# Patient Record
Sex: Female | Born: 1950 | Race: Black or African American | Hispanic: No | Smoking: Never smoker
Health system: Southern US, Community
[De-identification: ages and names within clinical notes are randomized; demographics above are authoritative.]

## PROBLEM LIST (undated history)

## (undated) ENCOUNTER — Emergency Department (HOSPITAL_COMMUNITY): Payer: Medicare Other | Source: Home / Self Care

## (undated) DIAGNOSIS — I10 Essential (primary) hypertension: Secondary | ICD-10-CM

## (undated) DIAGNOSIS — M797 Fibromyalgia: Secondary | ICD-10-CM

## (undated) DIAGNOSIS — Z9889 Other specified postprocedural states: Secondary | ICD-10-CM

## (undated) DIAGNOSIS — R112 Nausea with vomiting, unspecified: Secondary | ICD-10-CM

## (undated) DIAGNOSIS — M3500 Sicca syndrome, unspecified: Secondary | ICD-10-CM

## (undated) DIAGNOSIS — K219 Gastro-esophageal reflux disease without esophagitis: Secondary | ICD-10-CM

## (undated) HISTORY — PX: ESOPHAGEAL DILATION: SHX303

## (undated) HISTORY — PX: BREAST LUMPECTOMY: SHX2

## (undated) HISTORY — PX: REPLACEMENT TOTAL KNEE: SUR1224

## (undated) HISTORY — PX: ECTOPIC PREGNANCY SURGERY: SHX613

---

## 2005-01-31 ENCOUNTER — Ambulatory Visit: Payer: Self-pay | Admitting: Family Medicine

## 2005-08-12 HISTORY — PX: BREAST BIOPSY: SHX20

## 2006-06-04 ENCOUNTER — Ambulatory Visit: Payer: Self-pay | Admitting: Family Medicine

## 2006-06-11 ENCOUNTER — Ambulatory Visit: Payer: Self-pay | Admitting: Family Medicine

## 2006-07-22 ENCOUNTER — Ambulatory Visit: Payer: Self-pay | Admitting: General Surgery

## 2006-09-19 ENCOUNTER — Other Ambulatory Visit: Payer: Self-pay

## 2006-09-19 ENCOUNTER — Emergency Department: Payer: Self-pay

## 2007-01-16 ENCOUNTER — Ambulatory Visit: Payer: Self-pay | Admitting: General Surgery

## 2007-04-07 IMAGING — MG MM BREAST NEEDLE LOCALIZATION*L*
6 series · 6 of 6 positions shown · non-contrast
Comparison: none

REASON FOR EXAM: left breast microcalcification       surg 10 a m
COMMENTS:

[L CC (1 of 6)]
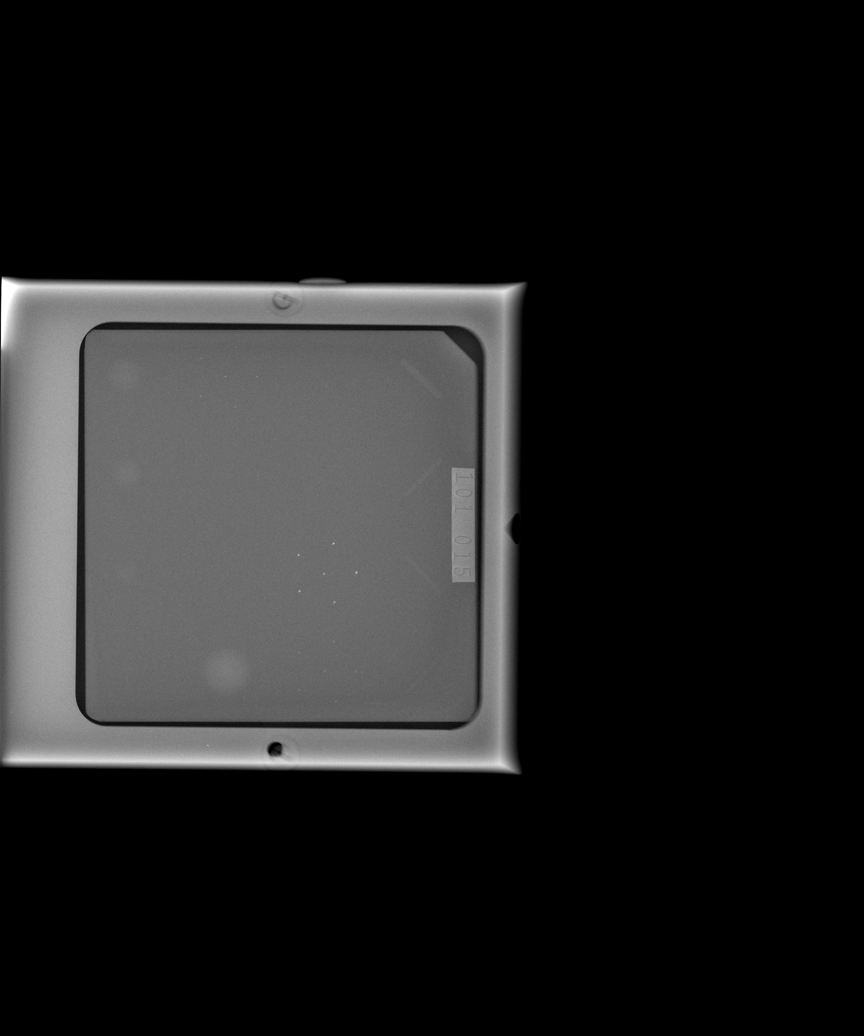

[L CC (2 of 6)]
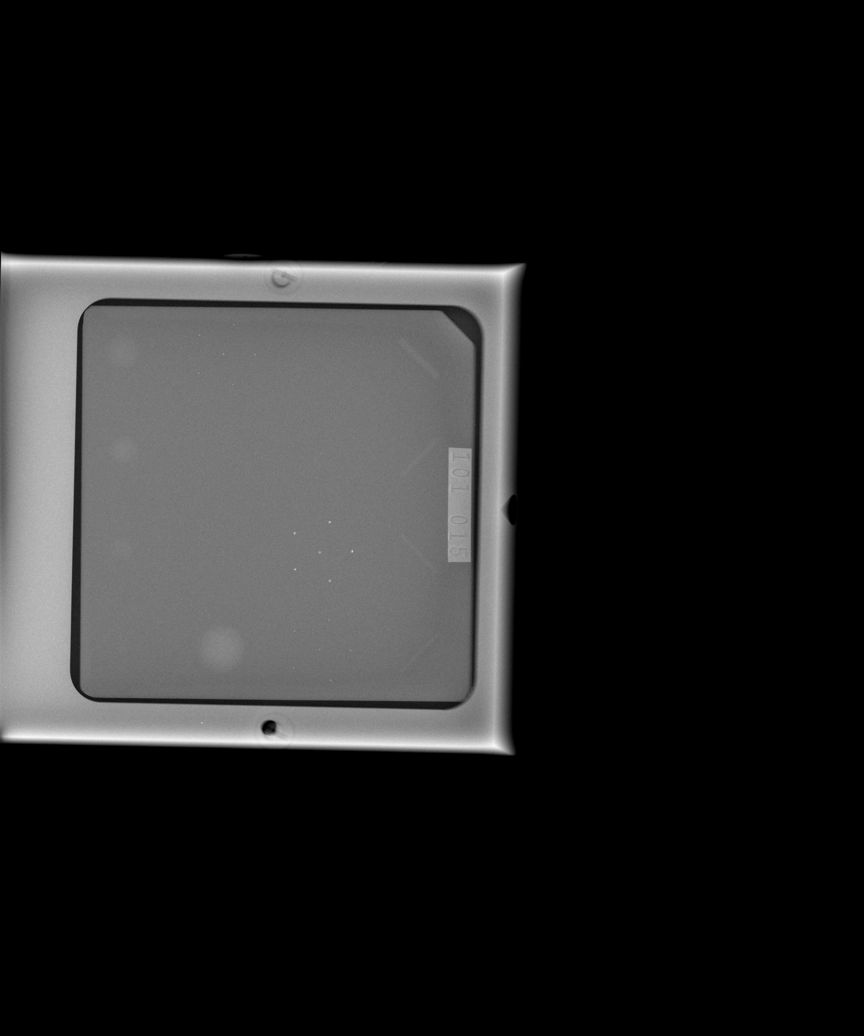

[L CC (3 of 6)]
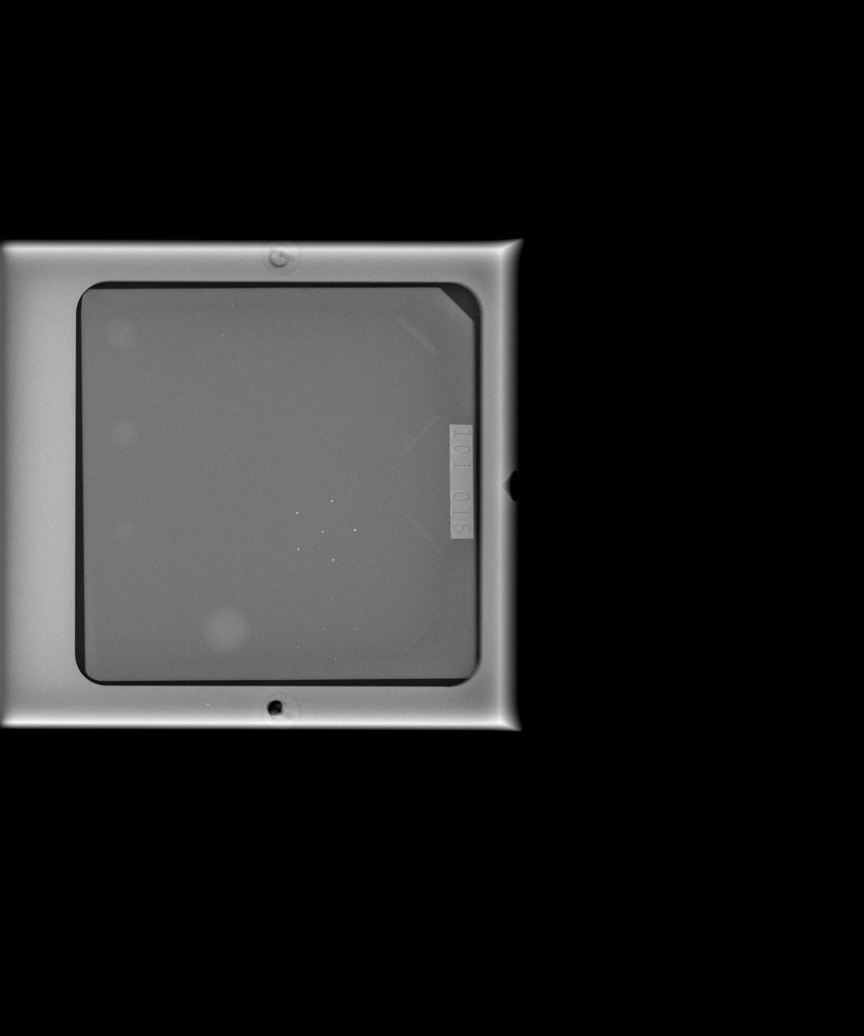

[L CC (4 of 6)]
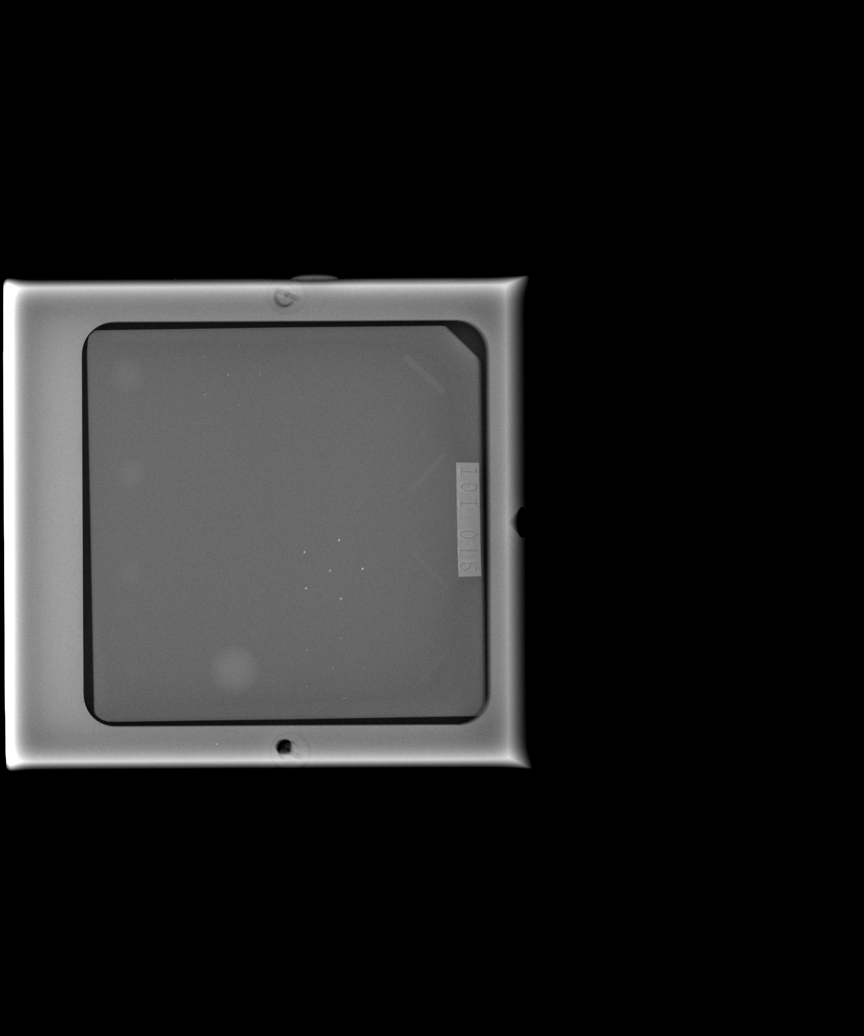

[L CC (5 of 6)]
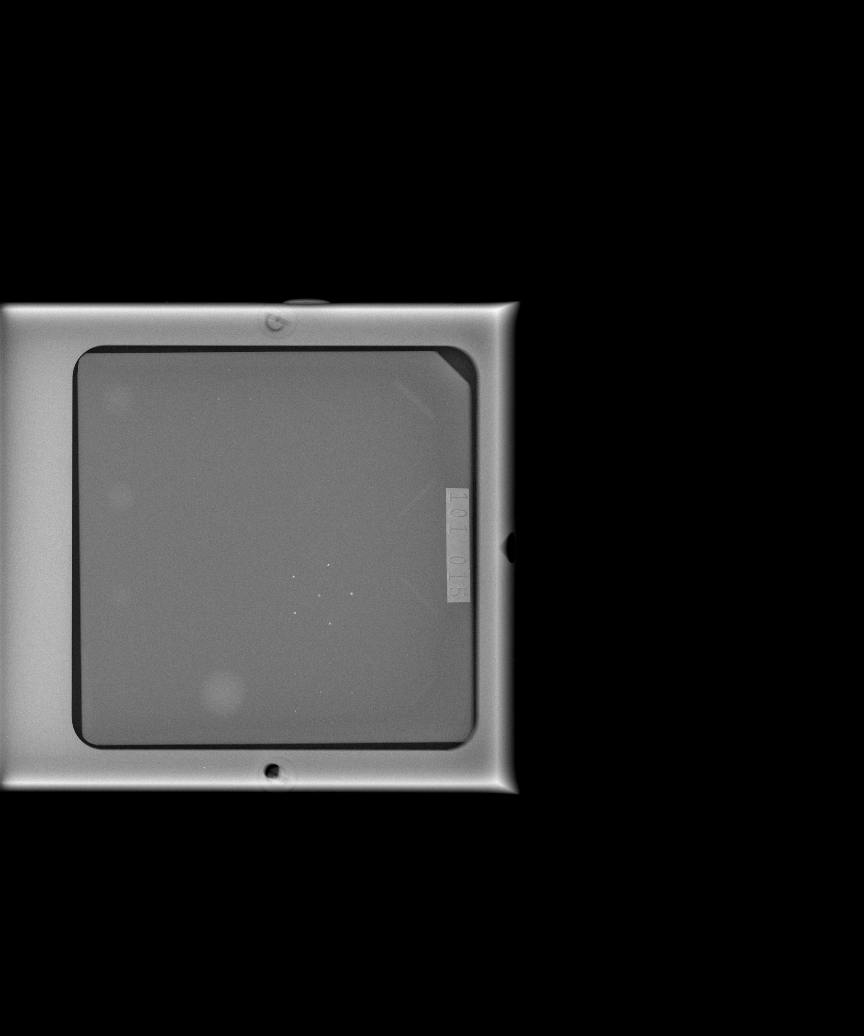

[L CC (6 of 6)]
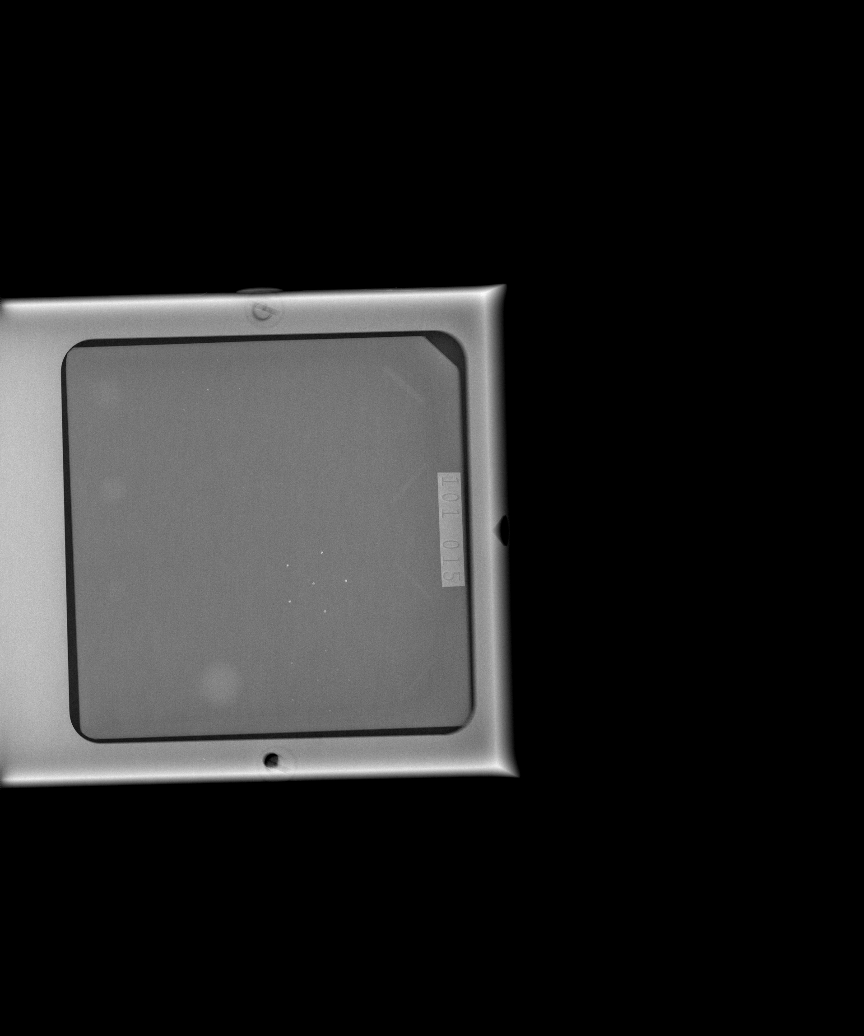

[6 of 6 positions shown; findings below may reference images not displayed]

PROCEDURE:     MAM - MAM BREAST NEEDLE LOCAL LT  - July 22, 2006  [DATE]

RESULT:     The previously recognized pathologic cluster of
microcalcifications in the LEFT breast was successfully localized with
mammographic guidance and using a Kopans hook wire and needle system under
sterile conditions. There were no complications.
IMPRESSION: 1)Successful needle localization.

## 2007-11-05 ENCOUNTER — Ambulatory Visit: Payer: Self-pay | Admitting: Family Medicine

## 2011-10-09 DIAGNOSIS — M3500 Sicca syndrome, unspecified: Secondary | ICD-10-CM | POA: Insufficient documentation

## 2012-06-29 ENCOUNTER — Ambulatory Visit: Payer: Self-pay | Admitting: Internal Medicine

## 2012-10-14 DIAGNOSIS — G8929 Other chronic pain: Secondary | ICD-10-CM | POA: Insufficient documentation

## 2012-10-14 DIAGNOSIS — M25569 Pain in unspecified knee: Secondary | ICD-10-CM | POA: Insufficient documentation

## 2013-04-10 IMAGING — MG MM CAD SCREENING MAMMO
1 series · 6 of 6 positions shown · non-contrast
Comparison: none

REASON FOR EXAM: SCR MAMMO NO ORDER
COMMENTS:

PROCEDURE:     MAM - MAM DGTL SCRN MAM NO ORDER W/CAD  - June 29, 2012  [DATE]
RESULT:     Comparison made to multiple prior exams dating back to
01/16/2007.Breast are dense and nodular with scattered calcifications. No new
mass or pathologic clustered noted. CAD evaluation is nonfocal.

[R CC · right · 6 of 6 slices shown]
[im 1/6]
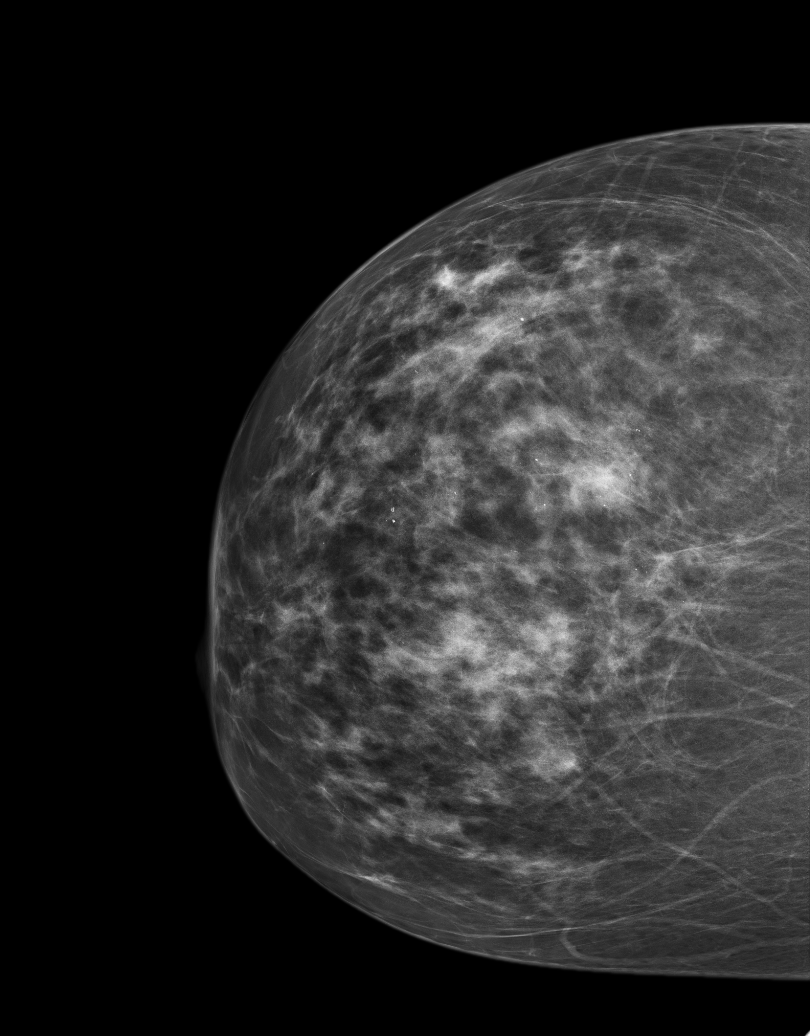
[im 2/6]
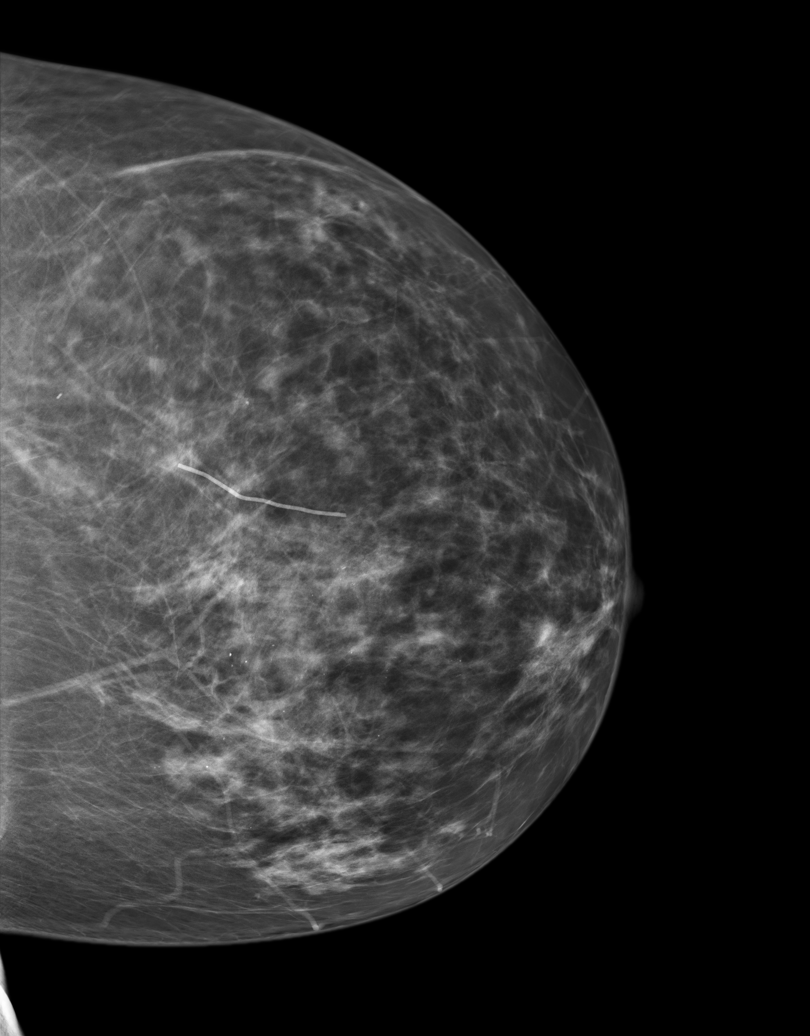
[im 3/6]
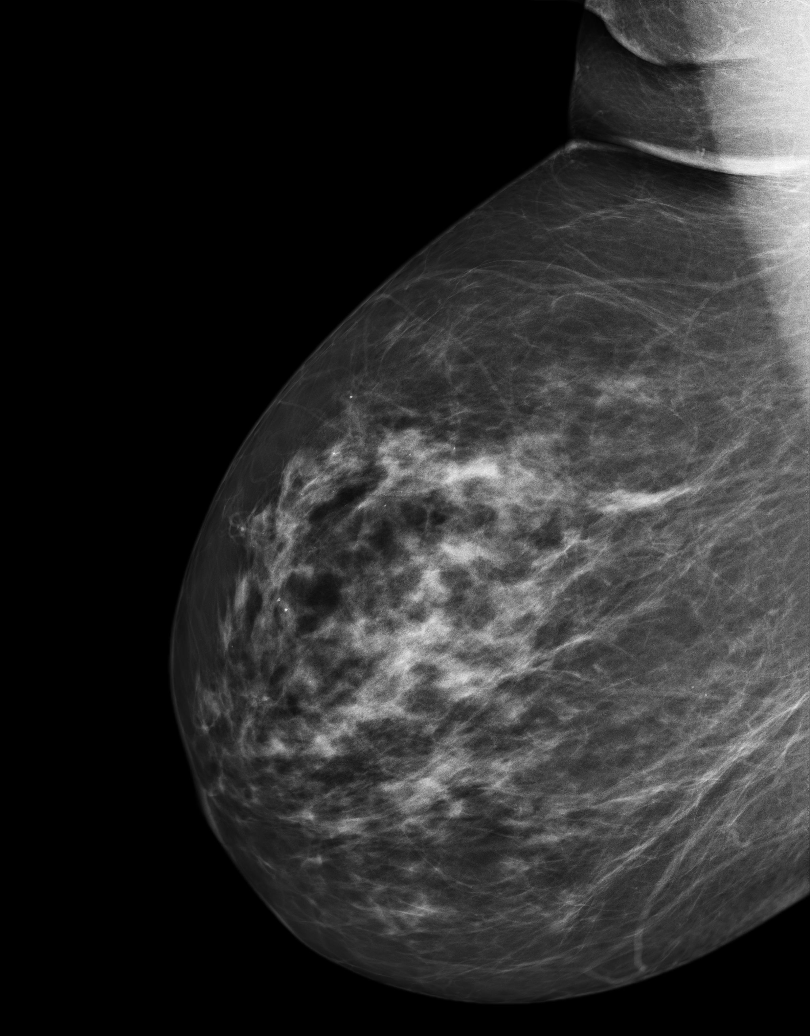
[im 4/6]
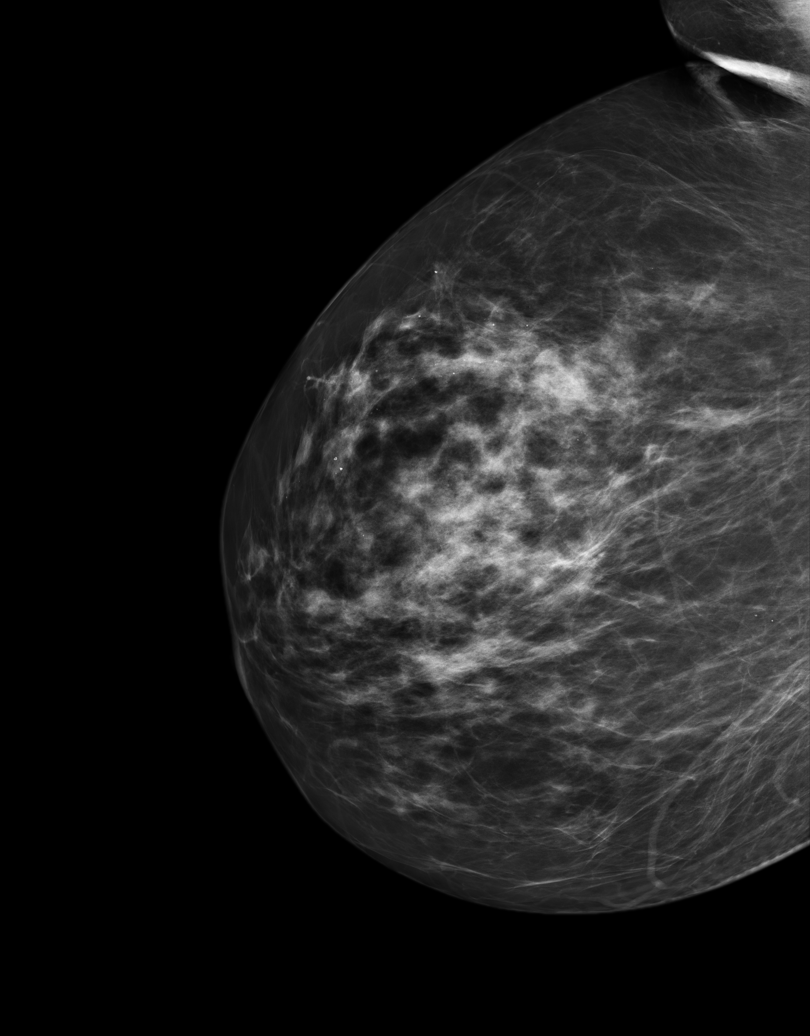
[im 5/6]
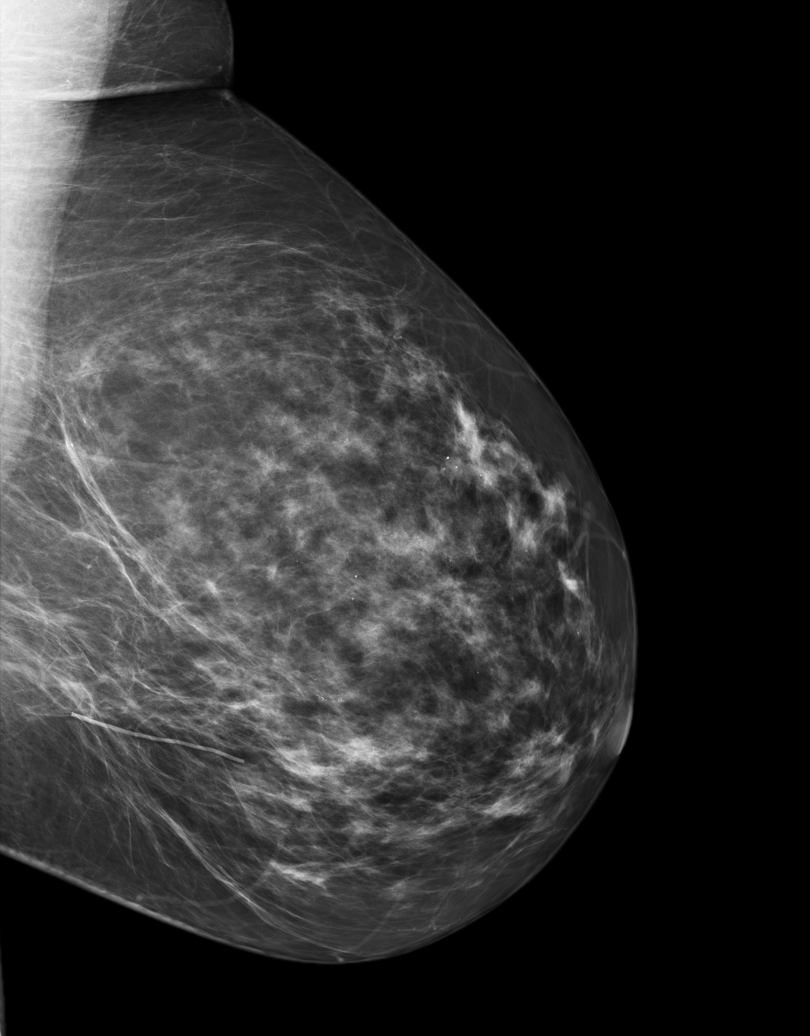
[im 6/6]
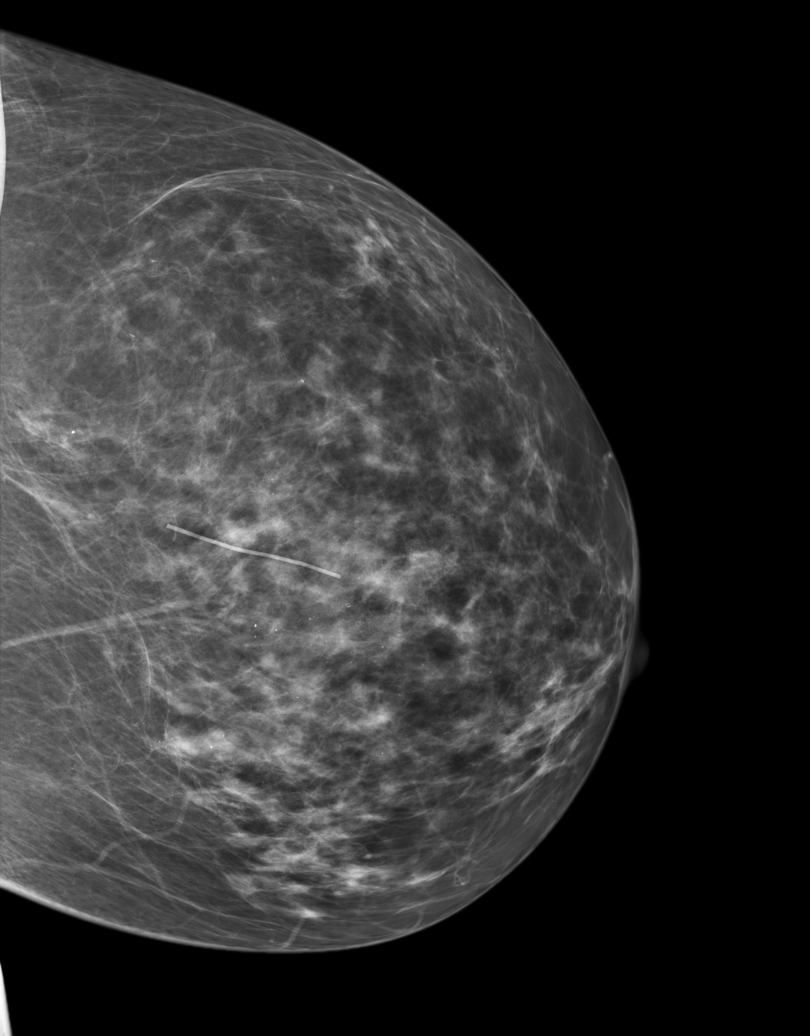

[6 of 6 positions shown; findings below may reference images not displayed]

IMPRESSION: Benign exam.

BI-RADS: Category 2- Benign Finding

A NEGATIVE MAMMOGRAM REPORT DOES NOT PRECLUDE BIOPSY OR OTHER EVALUATION OF
A CLINICALLY PALPABLE OR OTHERWISE SUSPICIOUS MASS OR LESION. BREAST CANCER
MAY NOT BE DETECTED IN UP TO 10% OF CASES.

## 2013-07-30 ENCOUNTER — Ambulatory Visit: Payer: Self-pay | Admitting: Internal Medicine

## 2015-01-12 DIAGNOSIS — M25519 Pain in unspecified shoulder: Secondary | ICD-10-CM | POA: Insufficient documentation

## 2015-01-12 DIAGNOSIS — M79673 Pain in unspecified foot: Secondary | ICD-10-CM | POA: Insufficient documentation

## 2015-01-12 DIAGNOSIS — M797 Fibromyalgia: Secondary | ICD-10-CM | POA: Insufficient documentation

## 2015-07-04 ENCOUNTER — Other Ambulatory Visit: Payer: Self-pay | Admitting: Internal Medicine

## 2015-07-04 DIAGNOSIS — Z1231 Encounter for screening mammogram for malignant neoplasm of breast: Secondary | ICD-10-CM

## 2015-07-13 ENCOUNTER — Ambulatory Visit
Admission: RE | Admit: 2015-07-13 | Discharge: 2015-07-13 | Disposition: A | Discharge status: Home / Self Care | Payer: Medicare Other | Source: Ambulatory Visit | Attending: Internal Medicine | Admitting: Internal Medicine

## 2015-07-13 DIAGNOSIS — Z1231 Encounter for screening mammogram for malignant neoplasm of breast: Secondary | ICD-10-CM | POA: Diagnosis not present

## 2016-03-19 ENCOUNTER — Telehealth: Payer: Self-pay | Admitting: Gastroenterology

## 2016-03-19 NOTE — Telephone Encounter (Signed)
colonoscopy

## 2016-03-19 NOTE — Telephone Encounter (Signed)
Please check with patient what type of insurance she has so I can add it.

## 2016-04-01 NOTE — Telephone Encounter (Signed)
Patient is not able to talk at this time. I will call patient back on Thursday after lunch time

## 2016-04-05 ENCOUNTER — Other Ambulatory Visit: Payer: Self-pay

## 2016-04-05 NOTE — Telephone Encounter (Signed)
Patient is going through some troubles with her car and other things as well. She is not in a position to have a colonoscopy right now. I let her know that I am going to send her a notification letter through the mail, she may contact me whenever she is ready to schedule her colonoscopy. Notification letter sent to the address on file, address has been verified with patient.

## 2016-04-05 NOTE — Telephone Encounter (Signed)
Called patient and left a voicemail 

## 2016-05-21 ENCOUNTER — Telehealth: Payer: Self-pay | Admitting: Gastroenterology

## 2016-05-21 NOTE — Telephone Encounter (Signed)
colonoscopy

## 2016-05-31 NOTE — Telephone Encounter (Signed)
Called patient to schedule and appointment with Dr. Tobi BastosAnna to get established before he preforms her colonoscopy. Patient wants to schedule 2 weeks out. I told her I can make and appointment for her 2 weeks out right now and she said no because she and her ex might be getting back together and she needs sometime to deal with that. I gave her our number and told her to call us when she is ready.

## 2017-04-23 ENCOUNTER — Other Ambulatory Visit: Payer: Self-pay | Admitting: Internal Medicine

## 2017-04-23 DIAGNOSIS — Z1231 Encounter for screening mammogram for malignant neoplasm of breast: Secondary | ICD-10-CM

## 2017-05-14 ENCOUNTER — Ambulatory Visit
Admission: RE | Admit: 2017-05-14 | Discharge: 2017-05-14 | Disposition: A | Discharge status: Home / Self Care | Payer: Medicare Other | Source: Ambulatory Visit | Attending: Internal Medicine | Admitting: Internal Medicine

## 2017-05-14 DIAGNOSIS — Z1231 Encounter for screening mammogram for malignant neoplasm of breast: Secondary | ICD-10-CM | POA: Insufficient documentation

## 2017-05-14 DIAGNOSIS — R921 Mammographic calcification found on diagnostic imaging of breast: Secondary | ICD-10-CM | POA: Insufficient documentation

## 2017-05-22 ENCOUNTER — Other Ambulatory Visit: Payer: Self-pay | Admitting: Internal Medicine

## 2017-05-22 DIAGNOSIS — R928 Other abnormal and inconclusive findings on diagnostic imaging of breast: Secondary | ICD-10-CM

## 2017-05-28 ENCOUNTER — Ambulatory Visit
Admission: RE | Admit: 2017-05-28 | Discharge: 2017-05-28 | Disposition: A | Discharge status: Home / Self Care | Payer: Medicare Other | Source: Ambulatory Visit | Attending: Internal Medicine | Admitting: Internal Medicine

## 2017-05-28 DIAGNOSIS — R921 Mammographic calcification found on diagnostic imaging of breast: Secondary | ICD-10-CM | POA: Insufficient documentation

## 2017-05-28 DIAGNOSIS — R928 Other abnormal and inconclusive findings on diagnostic imaging of breast: Secondary | ICD-10-CM | POA: Diagnosis present

## 2019-02-08 ENCOUNTER — Encounter: Payer: Self-pay | Admitting: *Deleted

## 2019-02-08 ENCOUNTER — Other Ambulatory Visit: Payer: Self-pay

## 2019-02-08 NOTE — Discharge Instructions (Signed)

## 2019-02-09 ENCOUNTER — Other Ambulatory Visit
Admission: RE | Admit: 2019-02-09 | Discharge: 2019-02-09 | Disposition: A | Discharge status: Home / Self Care | Payer: Medicare HMO | Source: Ambulatory Visit | Attending: Ophthalmology | Admitting: Ophthalmology

## 2019-02-09 DIAGNOSIS — Z1159 Encounter for screening for other viral diseases: Secondary | ICD-10-CM | POA: Insufficient documentation

## 2019-02-10 LAB — NOVEL CORONAVIRUS, NAA (HOSP ORDER, SEND-OUT TO REF LAB; TAT 18-24 HRS): SARS-CoV-2, NAA: NOT DETECTED

## 2019-02-12 ENCOUNTER — Encounter
Admission: RE | Disposition: A | Discharge status: Home / Self Care | Payer: Self-pay | Source: Home / Self Care | Attending: Ophthalmology

## 2019-02-12 ENCOUNTER — Ambulatory Visit: Payer: Medicare HMO | Admitting: Anesthesiology

## 2019-02-12 ENCOUNTER — Ambulatory Visit
Admission: RE | Admit: 2019-02-12 | Discharge: 2019-02-12 | Disposition: A | Discharge status: Home / Self Care | Payer: Medicare HMO | Attending: Ophthalmology | Admitting: Ophthalmology

## 2019-02-12 DIAGNOSIS — Z79899 Other long term (current) drug therapy: Secondary | ICD-10-CM | POA: Diagnosis not present

## 2019-02-12 DIAGNOSIS — K219 Gastro-esophageal reflux disease without esophagitis: Secondary | ICD-10-CM | POA: Diagnosis not present

## 2019-02-12 DIAGNOSIS — Z96652 Presence of left artificial knee joint: Secondary | ICD-10-CM | POA: Insufficient documentation

## 2019-02-12 DIAGNOSIS — H2512 Age-related nuclear cataract, left eye: Secondary | ICD-10-CM | POA: Insufficient documentation

## 2019-02-12 DIAGNOSIS — Z7982 Long term (current) use of aspirin: Secondary | ICD-10-CM | POA: Insufficient documentation

## 2019-02-12 DIAGNOSIS — M797 Fibromyalgia: Secondary | ICD-10-CM | POA: Insufficient documentation

## 2019-02-12 DIAGNOSIS — Z853 Personal history of malignant neoplasm of breast: Secondary | ICD-10-CM | POA: Diagnosis not present

## 2019-02-12 DIAGNOSIS — I10 Essential (primary) hypertension: Secondary | ICD-10-CM | POA: Insufficient documentation

## 2019-02-12 DIAGNOSIS — M1990 Unspecified osteoarthritis, unspecified site: Secondary | ICD-10-CM | POA: Diagnosis not present

## 2019-02-12 DIAGNOSIS — Z79811 Long term (current) use of aromatase inhibitors: Secondary | ICD-10-CM | POA: Diagnosis not present

## 2019-02-12 HISTORY — DX: Nausea with vomiting, unspecified: R11.2

## 2019-02-12 HISTORY — DX: Other specified postprocedural states: Z98.890

## 2019-02-12 HISTORY — DX: Fibromyalgia: M79.7

## 2019-02-12 HISTORY — PX: CATARACT EXTRACTION W/PHACO: SHX586

## 2019-02-12 HISTORY — DX: Gastro-esophageal reflux disease without esophagitis: K21.9

## 2019-02-12 HISTORY — DX: Essential (primary) hypertension: I10

## 2019-02-12 HISTORY — DX: Sjogren syndrome, unspecified: M35.00

## 2019-02-12 SURGERY — PHACOEMULSIFICATION, CATARACT, WITH IOL INSERTION
Anesthesia: Monitor Anesthesia Care | Site: Eye | Laterality: Left

## 2019-02-12 MED ORDER — ERYTHROMYCIN 5 MG/GM OP OINT
TOPICAL_OINTMENT | OPHTHALMIC | Status: DC | PRN
  Administered 2019-02-12: 1 via OPHTHALMIC

## 2019-02-12 MED ORDER — CEFUROXIME OPHTHALMIC INJECTION 1 MG/0.1 ML
INJECTION | OPHTHALMIC | Status: DC | PRN
  Administered 2019-02-12: 0.1 mL via INTRACAMERAL

## 2019-02-12 MED ORDER — BRIMONIDINE TARTRATE-TIMOLOL 0.2-0.5 % OP SOLN
OPHTHALMIC | Status: DC | PRN
  Administered 2019-02-12: 1 [drp] via OPHTHALMIC

## 2019-02-12 MED ORDER — MIDAZOLAM HCL 2 MG/2ML IJ SOLN
INTRAMUSCULAR | Status: DC | PRN
  Administered 2019-02-12: 0.5 mg via INTRAVENOUS
  Administered 2019-02-12: 1.5 mg via INTRAVENOUS

## 2019-02-12 MED ORDER — EPINEPHRINE PF 1 MG/ML IJ SOLN
INTRAOCULAR | Status: DC | PRN
  Administered 2019-02-12: 12:00:00 69 mL via OPHTHALMIC

## 2019-02-12 MED ORDER — FENTANYL CITRATE (PF) 100 MCG/2ML IJ SOLN
INTRAMUSCULAR | Status: DC | PRN
  Administered 2019-02-12: 50 ug via INTRAVENOUS

## 2019-02-12 MED ORDER — LACTATED RINGERS IV SOLN: 10.0000 mL/h | INTRAVENOUS | Status: DC

## 2019-02-12 MED ORDER — NA HYALUR & NA CHOND-NA HYALUR 0.4-0.35 ML IO KIT
PACK | INTRAOCULAR | Status: DC | PRN
  Administered 2019-02-12: 1 mL via INTRAOCULAR

## 2019-02-12 MED ORDER — LIDOCAINE HCL (PF) 2 % IJ SOLN
INTRAOCULAR | Status: DC | PRN
  Administered 2019-02-12: 1 mL

## 2019-02-12 MED ORDER — ARMC OPHTHALMIC DILATING DROPS
1.0000 "application " | OPHTHALMIC | Status: DC | PRN
  Administered 2019-02-12 (×3): 1 via OPHTHALMIC

## 2019-02-12 MED ORDER — TETRACAINE HCL 0.5 % OP SOLN
1.0000 [drp] | OPHTHALMIC | Status: DC | PRN
  Administered 2019-02-12 (×3): 1 [drp] via OPHTHALMIC

## 2019-02-12 MED ORDER — MOXIFLOXACIN HCL 0.5 % OP SOLN
1.0000 [drp] | OPHTHALMIC | Status: DC | PRN
  Administered 2019-02-12 (×3): 1 [drp] via OPHTHALMIC

## 2019-02-12 SURGICAL SUPPLY — 21 items
CANNULA ANT/CHMB 27G (MISCELLANEOUS) ×1 IMPLANT
CANNULA ANT/CHMB 27GA (MISCELLANEOUS) ×3 IMPLANT
GLOVE SURG LX 7.5 STRW (GLOVE) ×2
GLOVE SURG LX STRL 7.5 STRW (GLOVE) ×1 IMPLANT
GLOVE SURG TRIUMPH 8.0 PF LTX (GLOVE) ×3 IMPLANT
GOWN STRL REUS W/ TWL LRG LVL3 (GOWN DISPOSABLE) ×2 IMPLANT
GOWN STRL REUS W/TWL LRG LVL3 (GOWN DISPOSABLE) ×4
KIT SLEEVE INFUSION .9 MICRO (MISCELLANEOUS) ×2 IMPLANT
LENS IOL TECNIS ITEC 18.5 (Intraocular Lens) ×2 IMPLANT
MARKER SKIN DUAL TIP RULER LAB (MISCELLANEOUS) ×3 IMPLANT
NDL FILTER BLUNT 18X1 1/2 (NEEDLE) ×1 IMPLANT
NEEDLE FILTER BLUNT 18X 1/2SAF (NEEDLE) ×2
NEEDLE FILTER BLUNT 18X1 1/2 (NEEDLE) ×1 IMPLANT
PACK CATARACT BRASINGTON (MISCELLANEOUS) ×3 IMPLANT
PACK EYE AFTER SURG (MISCELLANEOUS) ×3 IMPLANT
PACK OPTHALMIC (MISCELLANEOUS) ×3 IMPLANT
SYR 3ML LL SCALE MARK (SYRINGE) ×3 IMPLANT
SYR 5ML LL (SYRINGE) ×3 IMPLANT
SYR TB 1ML LUER SLIP (SYRINGE) ×3 IMPLANT
WATER STERILE IRR 500ML POUR (IV SOLUTION) ×3 IMPLANT
WIPE NON LINTING 3.25X3.25 (MISCELLANEOUS) ×3 IMPLANT

## 2019-02-12 NOTE — Transfer of Care (Signed)
Immediate Anesthesia Transfer of Care Note  Patient: Vanessa Carey  Procedure(s) Performed: CATARACT EXTRACTION PHACO AND INTRAOCULAR LENS PLACEMENT ([place]) left (Left Eye)  Patient Location: PACU  Anesthesia Type: MAC  Level of Consciousness: awake, alert  and patient cooperative  Airway and Oxygen Therapy: Patient Spontanous Breathing and Patient connected to supplemental oxygen  Post-op Assessment: Post-op Vital signs reviewed, Patient's Cardiovascular Status Stable, Respiratory Function Stable, Patent Airway and No signs of Nausea or vomiting  Post-op Vital Signs: Reviewed and stable  Complications: No apparent anesthesia complications

## 2019-02-12 NOTE — Anesthesia Postprocedure Evaluation (Signed)
Anesthesia Post Note  Patient: MARGUERITA STAPP  Procedure(s) Performed: CATARACT EXTRACTION PHACO AND INTRAOCULAR LENS PLACEMENT ([place]) left (Left Eye)  Patient location during evaluation: PACU Anesthesia Type: MAC Level of consciousness: awake and alert Pain management: pain level controlled Vital Signs Assessment: post-procedure vital signs reviewed and stable Respiratory status: spontaneous breathing, nonlabored ventilation, respiratory function stable and patient connected to nasal cannula oxygen Cardiovascular status: stable and blood pressure returned to baseline Postop Assessment: no apparent nausea or vomiting Anesthetic complications: no    SCOURAS, NICOLE ELAINE

## 2019-02-12 NOTE — Anesthesia Procedure Notes (Signed)
Procedure Name: MAC Performed by: Misty Foutz, CRNA Pre-anesthesia Checklist: Patient identified, Emergency Drugs available, Suction available, Timeout performed and Patient being monitored Patient Re-evaluated:Patient Re-evaluated prior to induction Oxygen Delivery Method: Nasal cannula Placement Confirmation: positive ETCO2       

## 2019-02-12 NOTE — Anesthesia Preprocedure Evaluation (Signed)
Anesthesia Evaluation  Patient identified by MRN, date of birth, ID band Patient awake    Reviewed: Allergy & Precautions, H&P , NPO status , Patient's Chart, lab work & pertinent test results, reviewed documented beta blocker date and time   History of Anesthesia Complications (+) PONV and history of anesthetic complications  Airway Mallampati: II  TM Distance: >3 FB Neck ROM: full    Dental no notable dental hx.    Pulmonary neg pulmonary ROS,    Pulmonary exam normal breath sounds clear to auscultation       Cardiovascular Exercise Tolerance: Good hypertension,  Rhythm:regular Rate:Normal     Neuro/Psych Fibromyalgia negative psych ROS   GI/Hepatic Neg liver ROS, GERD  ,  Endo/Other  negative endocrine ROS  Renal/GU negative Renal ROS  negative genitourinary   Musculoskeletal   Abdominal   Peds  Hematology negative hematology ROS (+)   Anesthesia Other Findings   Reproductive/Obstetrics negative OB ROS                             Anesthesia Physical Anesthesia Plan  ASA: II  Anesthesia Plan: MAC   Post-op Pain Management:    Induction:   PONV Risk Score and Plan:   Airway Management Planned:   Additional Equipment:   Intra-op Plan:   Post-operative Plan:   Informed Consent: I have reviewed the patients History and Physical, chart, labs and discussed the procedure including the risks, benefits and alternatives for the proposed anesthesia with the patient or authorized representative who has indicated his/her understanding and acceptance.     Dental Advisory Given  Plan Discussed with: CRNA  Anesthesia Plan Comments:         Anesthesia Quick Evaluation

## 2019-02-12 NOTE — H&P (Signed)

## 2019-02-12 NOTE — Op Note (Signed)
OPERATIVE NOTE  Vanessa Carey [id number] 02/12/2019   PREOPERATIVE DIAGNOSIS:  Nuclear sclerotic cataract left eye. H25.12   POSTOPERATIVE DIAGNOSIS:    Nuclear sclerotic cataract left eye.     PROCEDURE:  Phacoemusification with posterior chamber intraocular lens placement of the left eye   LENS:   Implant Name Type Inv. Item Serial No. Manufacturer Lot No. LRB No. Used Action  LENS IOL DIOP 18.5 - [id number] Intraocular Lens LENS IOL DIOP 18.5 [id number] AMO  Left 1 Implanted        ULTRASOUND TIME: 12  % of 1 minutes 1 seconds, CDE 7.2  SURGEON:  Wyonia Hough, MD   ANESTHESIA:  Topical with tetracaine drops and 2% Xylocaine jelly, augmented with 1% preservative-free intracameral lidocaine.    COMPLICATIONS:  None.   DESCRIPTION OF PROCEDURE:  The patient was identified in the holding room and transported to the operating room and placed in the supine position under the operating microscope.  The left eye was identified as the operative eye and it was prepped and draped in the usual sterile ophthalmic fashion.   A 1 millimeter clear-corneal paracentesis was made at the 1:30 position.  0.5 ml of preservative-free 1% lidocaine was injected into the anterior chamber.  The anterior chamber was filled with Viscoat viscoelastic.  A 2.4 millimeter keratome was used to make a near-clear corneal incision at the 10:30 position.  .  A curvilinear capsulorrhexis was made with a cystotome and capsulorrhexis forceps.  Balanced salt solution was used to hydrodissect and hydrodelineate the nucleus.   Phacoemulsification was then used in stop and chop fashion to remove the lens nucleus and epinucleus.  The remaining cortex was then removed using the irrigation and aspiration handpiece. Provisc was then placed into the capsular bag to distend it for lens placement.  A lens was then injected into the capsular bag.  The remaining viscoelastic was aspirated.   Wounds were hydrated with  balanced salt solution.  The anterior chamber was inflated to a physiologic pressure with balanced salt solution.  No wound leaks were noted. Cefuroxime 0.1 ml of a 10mg /ml solution was injected into the anterior chamber for a dose of 1 mg of intracameral antibiotic at the completion of the case.   Timolol and Brimonidine drops were applied to the eye.  The patient was taken to the recovery room in stable condition without complications of anesthesia or surgery.  [name] 02/12/2019, 12:25 PM

## 2019-02-15 ENCOUNTER — Encounter: Payer: Self-pay | Admitting: Ophthalmology
